# Patient Record
Sex: Male | Born: 1955 | Race: White | Hispanic: Yes | Marital: Married | Smoking: Never smoker
Health system: Southern US, Community
[De-identification: ages and names within clinical notes are randomized; demographics above are authoritative.]

## PROBLEM LIST (undated history)

## (undated) DIAGNOSIS — I4891 Unspecified atrial fibrillation: Secondary | ICD-10-CM

## (undated) DIAGNOSIS — I10 Essential (primary) hypertension: Secondary | ICD-10-CM

## (undated) HISTORY — PX: REPLACEMENT TOTAL KNEE: SUR1224

## (undated) HISTORY — DX: Essential (primary) hypertension: I10

## (undated) HISTORY — DX: Unspecified atrial fibrillation: I48.91

## (undated) HISTORY — PX: SPLENECTOMY: SUR1306

## (undated) HISTORY — PX: PARTIAL HIP ARTHROPLASTY: SHX733

---

## 1985-03-23 HISTORY — PX: SPLENECTOMY: SUR1306

## 2005-01-21 ENCOUNTER — Other Ambulatory Visit: Payer: Self-pay

## 2005-01-21 ENCOUNTER — Emergency Department: Payer: Self-pay | Admitting: Emergency Medicine

## 2006-04-05 ENCOUNTER — Ambulatory Visit: Payer: Self-pay

## 2007-09-13 ENCOUNTER — Encounter: Admission: RE | Admit: 2007-09-13 | Discharge: 2007-09-13 | Payer: Self-pay | Admitting: Orthopedic Surgery

## 2008-01-12 ENCOUNTER — Encounter: Admission: RE | Admit: 2008-01-12 | Discharge: 2008-01-12 | Payer: Self-pay | Admitting: Orthopedic Surgery

## 2008-03-23 HISTORY — PX: PARTIAL HIP ARTHROPLASTY: SHX733

## 2008-05-31 ENCOUNTER — Encounter: Admission: RE | Admit: 2008-05-31 | Discharge: 2008-05-31 | Payer: Self-pay | Admitting: Orthopedic Surgery

## 2008-08-22 ENCOUNTER — Inpatient Hospital Stay (HOSPITAL_COMMUNITY): Admission: RE | Admit: 2008-08-22 | Discharge: 2008-08-25 | Payer: Self-pay | Admitting: Orthopedic Surgery

## 2010-01-29 ENCOUNTER — Encounter: Admission: RE | Admit: 2010-01-29 | Discharge: 2010-01-29 | Payer: Self-pay | Admitting: Orthopedic Surgery

## 2010-04-12 ENCOUNTER — Encounter: Payer: Self-pay | Admitting: Orthopedic Surgery

## 2010-06-30 LAB — CBC
HCT: 34.6 % — ABNORMAL LOW (ref 39.0–52.0)
HCT: 35.8 % — ABNORMAL LOW (ref 39.0–52.0)
Hemoglobin: 11.8 g/dL — ABNORMAL LOW (ref 13.0–17.0)
Hemoglobin: 12 g/dL — ABNORMAL LOW (ref 13.0–17.0)
MCHC: 33.4 g/dL (ref 30.0–36.0)
MCV: 94.5 fL (ref 78.0–100.0)
MCV: 94.6 fL (ref 78.0–100.0)
MCV: 94.8 fL (ref 78.0–100.0)
Platelets: 149 10*3/uL — ABNORMAL LOW (ref 150–400)
RBC: 3.73 MIL/uL — ABNORMAL LOW (ref 4.22–5.81)
RDW: 13 % (ref 11.5–15.5)
RDW: 13.2 % (ref 11.5–15.5)

## 2010-06-30 LAB — BASIC METABOLIC PANEL
BUN: 5 mg/dL — ABNORMAL LOW (ref 6–23)
CO2: 31 mEq/L (ref 19–32)
CO2: 32 mEq/L (ref 19–32)
Chloride: 103 mEq/L (ref 96–112)
Chloride: 103 mEq/L (ref 96–112)
Chloride: 103 mEq/L (ref 96–112)
GFR calc Af Amer: >60 mL/min (ref >60)
GFR calc non Af Amer: >60 mL/min (ref >60)
Glucose, Bld: 108 mg/dL — ABNORMAL HIGH (ref 70–99)
Glucose, Bld: 109 mg/dL — ABNORMAL HIGH (ref 70–99)
Potassium: 3.3 mEq/L — ABNORMAL LOW (ref 3.5–5.1)
Potassium: 3.9 mEq/L (ref 3.5–5.1)
Sodium: 138 mEq/L (ref 135–145)
Sodium: 139 mEq/L (ref 135–145)

## 2010-06-30 LAB — TYPE AND SCREEN

## 2010-07-01 LAB — URINALYSIS, ROUTINE W REFLEX MICROSCOPIC
Bilirubin Urine: NEGATIVE
Glucose, UA: NEGATIVE mg/dL
Hgb urine dipstick: NEGATIVE
Specific Gravity, Urine: 1.011 (ref 1.005–1.030)

## 2010-07-01 LAB — COMPREHENSIVE METABOLIC PANEL
ALT: 18 U/L (ref 0–53)
Albumin: 3.8 g/dL (ref 3.5–5.2)
Alkaline Phosphatase: 85 U/L (ref 39–117)
BUN: 14 mg/dL (ref 6–23)
Chloride: 105 mEq/L (ref 96–112)
Glucose, Bld: 95 mg/dL (ref 70–99)
Potassium: 4.5 mEq/L (ref 3.5–5.1)
Sodium: 142 mEq/L (ref 135–145)
Total Bilirubin: 0.9 mg/dL (ref 0.3–1.2)
Total Protein: 6.6 g/dL (ref 6.0–8.3)

## 2010-07-01 LAB — CBC
MCHC: 33.5 g/dL (ref 30.0–36.0)
MCV: 95 fL (ref 78.0–100.0)
RBC: 4.7 MIL/uL (ref 4.22–5.81)
RDW: 13.4 % (ref 11.5–15.5)

## 2010-07-01 LAB — PROTIME-INR: Prothrombin Time: 12.8 seconds (ref 11.6–15.2)

## 2010-08-05 NOTE — Op Note (Signed)
NAMEWALLACE, GAPPA             ACCOUNT NO.:  [id number]   MEDICAL RECORD NO.:  [id number]          PATIENT TYPE:  INP   LOCATION:  0004                         FACILITY:  [facility]   PHYSICIAN:  Ollen Gross, M.D.    DATE OF BIRTH:  12-06-1955   DATE OF PROCEDURE:  08/22/2008  DATE OF DISCHARGE:                               OPERATIVE REPORT   PREOPERATIVE DIAGNOSIS:  Osteoarthritis, left hip.   POSTOPERATIVE DIAGNOSIS:  Osteoarthritis, left hip.   PROCEDURE:  Left total hip arthroplasty.   SURGEON:  Ollen Gross, M.D.   ASSISTANT:  Avel Peace, P.A.-C.   ANESTHESIA:  General.   ESTIMATED BLOOD LOSS:  300.   DRAINS:  Hemovac x1.   COMPLICATIONS:  None.   CONDITION:  Stable to recovery.   BRIEF CLINICAL NOTE:  Mr. Lederman is a 55 year old male with severe end-  stage arthritis of his left hip with some collapse of the femoral head.  He has had progressively worsening pain and dysfunction and presents now  for total hip arthroplasty.   PROCEDURE IN DETAIL:  After successful administration of general  anesthetic, the patient was placed in the right lateral decubitus  position with the left side up and held with the hip positioner.  The  left lower extremity was isolated from his perineum with plastic drapes  and prepped and draped in the usual sterile fashion.  Short  posterolateral incision was made with a 10 blade through subcutaneous  tissue to the level of the fascia lata which was incised in line with  the skin incision.  Sciatic nerve was palpated and protected and short  external rotator isolated off the femur.  Capsulotomy was performed, and  the hip was dislocated.  The hip was grossly degenerative with  flattening and large spur formation.  Center of femoral head was marked,  and a trial prosthesis was then placed such that the center of the trial  head corresponds to the center of his native femoral head.  Osteotomy  line was marked on the femoral neck, and  osteotomy made with an  oscillating saw.   Femoral head was removed and the femur retracted anteriorly to gain  acetabular exposure.  The acetabular retractors were placed and labrum  and large osteophytes removed.  Acetabular reaming starts at 49 mm  coursing in increments of 2 to 57 mm.  There were 2 large cysts in the  acetabulum which were curetted out, and then reamings were packed into  them to graft them.  The 58-mm pinnacle acetabular shell was then placed  in anatomic position and transfixed with 2 dome screws.  The apex hole  eliminator was placed, and the permanent 40-mm neutral Ultamet metal  liner was placed for metal-on-metal hip replacement.   The femur was then prepared with the canal finder and irrigation.  Axial  reaming was performed up to 13.5 mm.  The proximal reaming was performed  up to an 46F and the sleeve machined to an extra-extra large.  An 46F  extra-extra large trial sleeve was placed and 18 x 13 stem and a 36 plus  12 neck matching native anteversion.  The 40 plus 6 head was placed, and  that reduced with some soft tissue laxity, so went to a 40 plus 9 which  had more appropriate soft-tissue tension.  Stability was excellent full  extension, full external rotation, 70 degrees of flexion, 40 degrees of  adduction and 90 degrees of internal rotation and 90 degrees of flexion  and about 70 degrees of internal rotation.  By placing the left leg on  top of the right, it felt as though the leg lengths were equal.  The hip  was then dislocated and trials removed.  The permanent 30F extra-extra  large sleeve was placed and a permanent 18 x 13 stem and 36 plus 12 neck  matching native anteversion.  A 40 plus 6 head was placed and the hips  reduced with the same stability parameters.  Wounds were copiously  irrigated with saline solution and short rotators and capsule reattached  to the femur through drill holes.  Fascia lata was closed over a Hemovac  drain with  interrupted #1 Vicryl, subcutaneous tissue closed with #1-0  and #2-0 Vicryl and subcuticular running 4-0 Monocryl.  The drain was  hooked to suction.  Incision cleaned and dried, and Steri-Strips and  bulky sterile dressing applied.  She was then awakened and transported  to recovery in stable condition.      Ollen Gross, M.D.  Electronically Signed     FA/MEDQ  D:  08/22/2008  T:  08/22/2008  Job:  [id number]

## 2010-08-05 NOTE — H&P (Signed)
NAMEELIN, SEATS NO.:  [id number]   MEDICAL RECORD NO.:  [id number]          PATIENT TYPE:  INP   LOCATION:  1526                         FACILITY:  [facility]   PHYSICIAN:  Ollen Gross, M.D.    DATE OF BIRTH:  [birth year]   DATE OF ADMISSION:  08/22/2008  DATE OF DISCHARGE:                              HISTORY & PHYSICAL   Date of office visit history and physical Aug 07, 2008.   CHIEF COMPLAINT:  Left hip pain.   HISTORY OF PRESENT ILLNESS:  The patient is a 55 year old male who has  been seen over at [facility] for ongoing left hip pain.  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  He has developed end-stage bone-on-bone arthritis  throughout the hip.  He has undergone cortisone injection in the past  which did provide symptomatic relief for a short period of time but  continues to have progressive pain.  He is found now to have end-stage  severe osteoarthritis with flattening of the femoral head and large  cystic changes.  It is felt at this point he would benefit from  undergoing surgical intervention.  Risks and benefits have been  discussed, and he elects to proceed with surgery.  He has been seen by  Dr. Darrold Junker and felt to be a low risk for up and coming surgery,  recommended continuing his metoprolol throughout the surgery.   ALLERGIES:  SULFA with a childhood reaction.   CURRENT MEDICATIONS:  Mobic, metoprolol, vitamin C, vitamin D, Super B  Complex, potassium and Flonase nasal spray.   PAST MEDICAL HISTORY:  1. Sleep apnea for which he uses CPAP.  2. Past history of paroxysmal atrial fibrillation.   PAST SURGICAL HISTORY:  1. Knee surgery in 1985.  2. Ruptured spleen in 1987 (no problems with anesthesia).   FAMILY HISTORY:  Father deceased age 39 with a heart attack.  Mother age  54 with osteoarthritis.   SOCIAL HISTORY:  He is married, works as a Museum/gallery curator,  nonsmoker, no alcohol.  He does have a care giver lined up after  surgery.  He has 4 steps entering his 1-level home.   REVIEW OF SYSTEMS:  GENERAL:  No fevers, chills or night sweats.  NEUROLOGICAL:  No seizure, syncope or paralysis.  RESPIRATORY:  No  shortness of breath, productive cough or hemoptysis.  CARDIOVASCULAR:  No chest pain, angina or orthopnea.  GASTROINTESTINAL:  No nausea,  vomiting, diarrhea or constipation.  GENITOURINARY:  No dysuria,  hematuria or discharge.  MUSCULOSKELETAL:  Left hip.   PHYSICAL EXAMINATION:  VITAL SIGNS:  Pulse 68, respirations 12, blood  pressure 112/64.  GENERAL:  A 55 year old, tall, African-American male, well-developed,  well-nourished, in no acute distress.  He is accompanied by his wife.  HEENT:  Normocephalic, atraumatic.  Pupils are round and reactive.  Oropharynx clear.  EOMs intact.  NECK:  Supple.  CHEST:  Clear.  HEART:  Regular rate and rhythm.  No murmur.  S1-S2 noted.  ABDOMEN:  Soft, nontender.  Bowel sounds present.  RECTAL, BREAST, GENITALIA:  Not done, not pertinent to present illness.  EXTREMITIES:  Left hip flexion 100 zero internal rotation 20 degrees  external rotation.  Pulses noted to be intact.   IMPRESSION:  Osteoarthritis, left hip.   PLAN:  The patient admitted to the [facility] to undergo a  left total hip replacement arthroplasty.  Surgery will be performed by  Dr. Ollen Gross.      Alexzandrew L. Lorensen, P.A.C.      Ollen Gross, M.D.  Electronically Signed    ALP/MEDQ  D:  08/22/2008  T:  08/22/2008  Job:  [id number]   cc:   Ollen Gross, M.D.  Fax: [phone]   Jamse Mead, M.D.  [facility]  [address] [place], [place] [address]  [phone]

## 2010-08-08 NOTE — Discharge Summary (Signed)
Oscar Pennington, Oscar Pennington             ACCOUNT NO.:  [id number]   MEDICAL RECORD NO.:  [id number]          PATIENT TYPE:  INP   LOCATION:  1526                         FACILITY:  [facility]   PHYSICIAN:  Ollen Gross, M.D.    DATE OF BIRTH:  1956-02-05   DATE OF ADMISSION:  08/22/2008  DATE OF DISCHARGE:  08/25/2008                               DISCHARGE SUMMARY   ADMITTING DIAGNOSES.:  1. Osteoarthritis left hip.  2. Sleep apnea, which he uses a CPAP.  3. Past history of paroxysmal atrial fibrillation.   DISCHARGE DIAGNOSES:  1. Osteoarthritis left hip status post left total hip replacement      arthroplasty.  2. Sleep apnea, which he uses a CPAP.  3. Past history of paroxysmal atrial fibrillation.  4. Mild postop hypokalemia.   PROCEDURE:  August 22, 2008 left total hip.   SURGEON:  Dr. Lequita Halt.   ASSISTANT:  Alexzandrew L. Noguera, P.A.C.   ANESTHESIA:  General.   CONSULTS:  None.   BRIEF HISTORY:  Oscar Pennington is a 55 year old male with severe end-stage  arthritis of the left hip, progressive pain with collapse of femoral  head, now presents for total hip arthroplasty.   LABORATORY DATA:  Preop CBC showed a hemoglobin of 14.9, hematocrit  44.7, white cell count 5.6, platelets 186.  Postop hemoglobin at 12 and  then 11.8, last H and H 11.4 and 34.6.  PT/PTT 12.8 and 27.0  respectively.  INR 1.0.  Serial Protime's followed per Coumadin  protocol.  Last PT/INR 21.6, 1.8. Chem panel on admission all within  normal limits.  Serial BMET's were followed.  Potassium dropped 4.5 to  3.6.  Last noted at 3.3.  Remaining electrolytes remained within normal  limits.  Preoperative urine was negative.  Blood group type was O  negative.   HOSPITAL COURSE:  The patient admitted to [facility] and taken to  the OR, underwent above-stated procedure without complication.  The  patient tolerated the procedure well, later transferred to the  orthopedic floor, had a decent night, a little bit  of  soreness on  morning of day 1.  Hemoglobin stable.  Had a history of atrial fib but  the rate was in the 70s, well-controlled.  He is on beta blockers as  needed.  Hemovac drain placed at the time of surgery was pulled without  difficulty.  Started  getting up out of bed.  He was partial  weightbearing.  Did well with his therapy.  Already walking 200-300 feet  on day 1.  By day 2,  he was doing well, progressing with his goals of  therapy and thought he would be ready to go home by the following day.  Seen on day 3, walking over 400 feet and then discharged home.   DISCHARGE/PLAN:  The patient discharged home on August 25, 2008.   DISCHARGE DIAGNOSES:  Please see above.   DISCHARGE MEDS:  Percocet, Robaxin, Coumadin.   DIET:  Heart-healthy.   ACTIVITIES:  Partial weightbearing 25-50% left leg.   HIP PRECAUTIONS:  Total hip protocol.   DISPOSITION:  Home.  CONDITION ON DISCHARGE:  Improved.      Alexzandrew L. Weichert, P.A.C.      Ollen Gross, M.D.  Electronically Signed    ALP/MEDQ  D:  10/02/2008  T:  10/02/2008  Job:  [id number]   cc:   Ollen Gross, M.D.  Fax: [phone]   Carney Bern, Dr.

## 2011-07-20 ENCOUNTER — Emergency Department: Payer: Self-pay | Admitting: Emergency Medicine

## 2011-07-20 LAB — URINALYSIS, COMPLETE
Bacteria: NONE SEEN
Bilirubin,UR: NEGATIVE
Blood: NEGATIVE
Glucose,UR: NEGATIVE mg/dL (ref 0–75)
Leukocyte Esterase: NEGATIVE
RBC,UR: NONE SEEN /HPF (ref 0–5)
Specific Gravity: 1.001 (ref 1.003–1.030)
Squamous Epithelial: NONE SEEN

## 2011-07-20 LAB — CBC
HCT: 44.4 % (ref 40.0–52.0)
HGB: 14.8 g/dL (ref 13.0–18.0)
MCHC: 33.4 g/dL (ref 32.0–36.0)
RBC: 4.73 10*6/uL (ref 4.40–5.90)

## 2011-07-20 LAB — COMPREHENSIVE METABOLIC PANEL
Albumin: 3.7 g/dL (ref 3.4–5.0)
Anion Gap: 10 (ref 7–16)
BUN: 9 mg/dL (ref 7–18)
Calcium, Total: 9.1 mg/dL (ref 8.5–10.1)
Chloride: 102 mmol/L (ref 98–107)
Co2: 27 mmol/L (ref 21–32)
Glucose: 109 mg/dL — ABNORMAL HIGH (ref 65–99)
Osmolality: 277 (ref 275–301)
SGPT (ALT): 45 U/L

## 2011-07-20 LAB — MAGNESIUM: Magnesium: 1.5 mg/dL — ABNORMAL LOW

## 2011-07-20 LAB — CK TOTAL AND CKMB (NOT AT ARMC)
CK, Total: 152 U/L (ref 35–232)
CK-MB: 1.1 ng/mL (ref 0.5–3.6)

## 2011-07-20 LAB — PRO B NATRIURETIC PEPTIDE: B-Type Natriuretic Peptide: 158 pg/mL — ABNORMAL HIGH (ref 0–125)

## 2012-10-06 ENCOUNTER — Emergency Department: Payer: Self-pay | Admitting: Internal Medicine

## 2012-10-06 LAB — BASIC METABOLIC PANEL
Anion Gap: 5 — ABNORMAL LOW (ref 7–16)
BUN: 8 mg/dL (ref 7–18)
Calcium, Total: 9.1 mg/dL (ref 8.5–10.1)
Co2: 29 mmol/L (ref 21–32)
Creatinine: 1.01 mg/dL (ref 0.60–1.30)
EGFR (African American): >60
Glucose: 90 mg/dL (ref 65–99)
Potassium: 3.7 mmol/L (ref 3.5–5.1)
Sodium: 138 mmol/L (ref 136–145)

## 2012-10-06 LAB — CBC
HGB: 14.2 g/dL (ref 13.0–18.0)
MCH: 32.1 pg (ref 26.0–34.0)
Platelet: 145 10*3/uL — ABNORMAL LOW (ref 150–440)
RBC: 4.44 10*6/uL (ref 4.40–5.90)
RDW: 13.6 % (ref 11.5–14.5)
WBC: 5.5 10*3/uL (ref 3.8–10.6)

## 2012-10-06 LAB — TSH: Thyroid Stimulating Horm: 1.14 u[IU]/mL

## 2013-10-11 DIAGNOSIS — N529 Male erectile dysfunction, unspecified: Secondary | ICD-10-CM | POA: Insufficient documentation

## 2014-05-28 DIAGNOSIS — I10 Essential (primary) hypertension: Secondary | ICD-10-CM | POA: Insufficient documentation

## 2015-05-18 IMAGING — CR DG CHEST 2V
1 series · 2 of 2 positions shown · non-contrast
Comparison: none

REASON FOR EXAM: Chest Pain
COMMENTS:

[Series 1: w chest pa · 0.14mm/px · 2 of 2 slices shown]
[im 1/2]
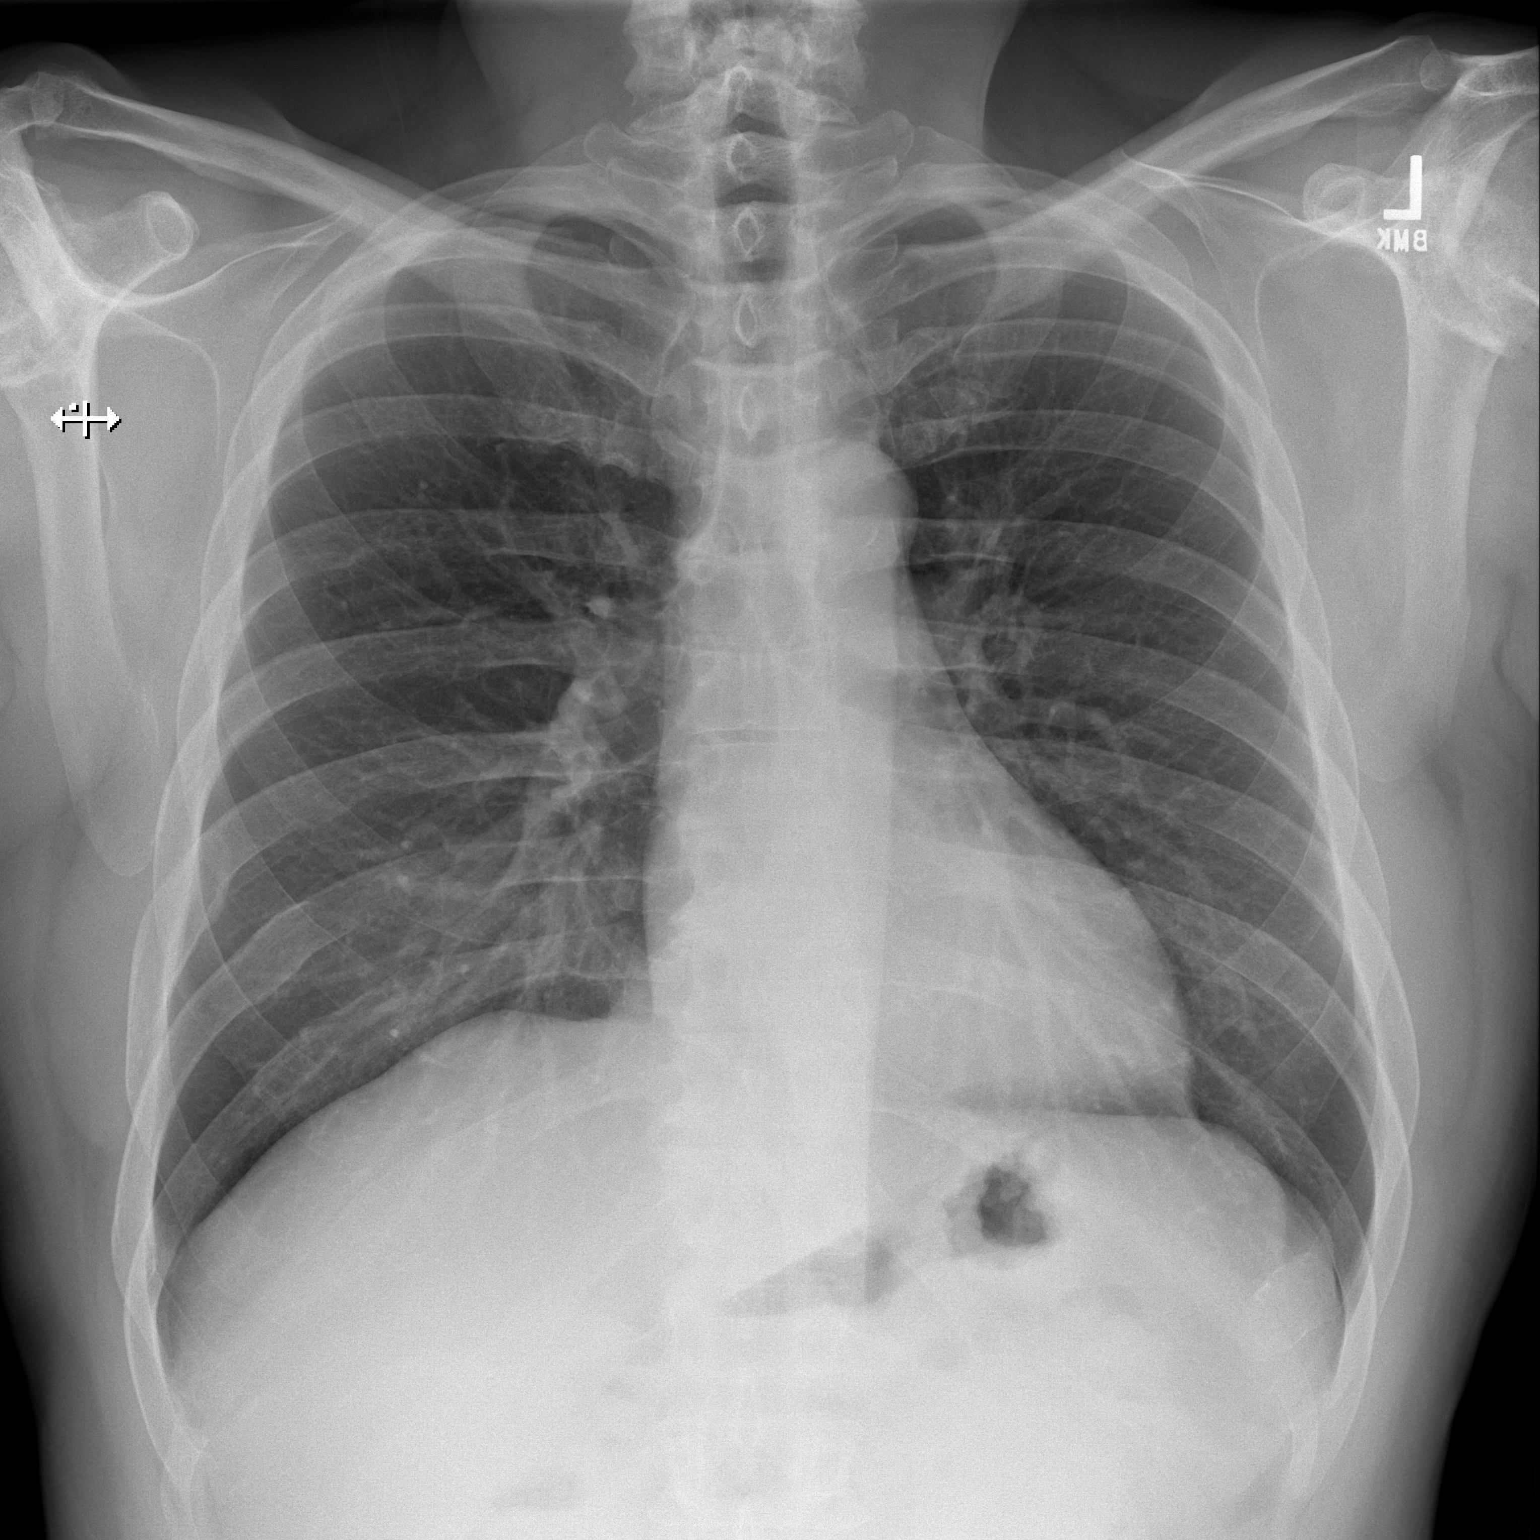
[im 2/2]
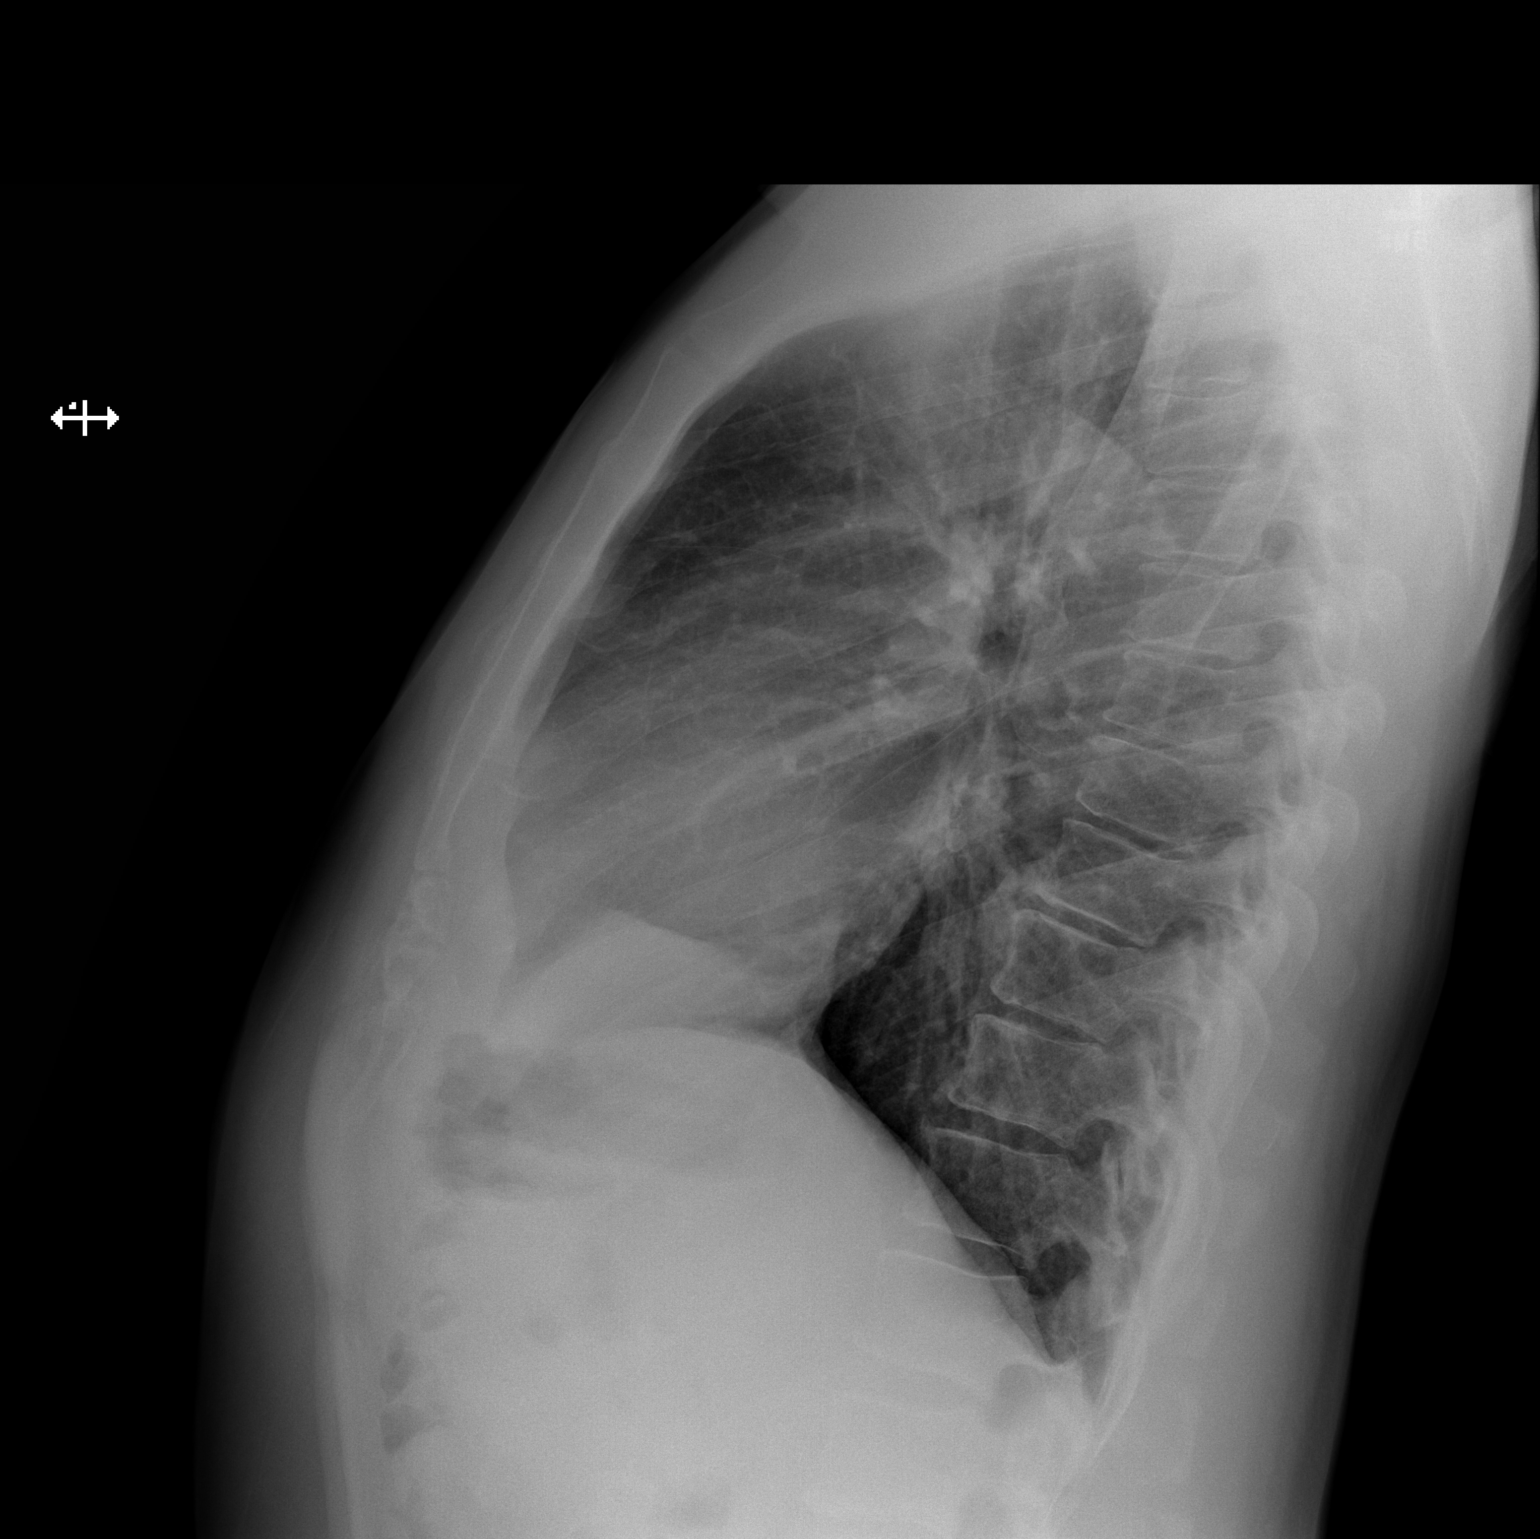

[2 of 2 positions shown; findings below may reference images not displayed]

PROCEDURE:     DXR - DXR CHEST PA (OR AP) AND LATERAL  - October 06, 2012  [DATE]

RESULT:     Comparison is made to the study 20 July, 2011.

The lungs are well-expanded. There is mild prominence of perihilar lung
markings. The cardiac silhouette is normal in size. The pulmonary
vascularity is not engorged. There is no pleural effusion.
IMPRESSION: There is mild prominence of the pulmonary interstitial
markings which could reflect minimal interstitial edema in the appropriate
clinical setting. There is no focal pneumonia nor evidence of a pleural
effusion.

[REDACTED]

## 2016-03-06 ENCOUNTER — Emergency Department
Admission: EM | Admit: 2016-03-06 | Discharge: 2016-03-06 | Disposition: A | Discharge status: Home / Self Care | Payer: BC Managed Care – PPO | Attending: Emergency Medicine | Admitting: Emergency Medicine

## 2016-03-06 ENCOUNTER — Encounter: Payer: Self-pay | Admitting: Emergency Medicine

## 2016-03-06 DIAGNOSIS — S76311A Strain of muscle, fascia and tendon of the posterior muscle group at thigh level, right thigh, initial encounter: Secondary | ICD-10-CM | POA: Diagnosis not present

## 2016-03-06 DIAGNOSIS — Y929 Unspecified place or not applicable: Secondary | ICD-10-CM | POA: Insufficient documentation

## 2016-03-06 DIAGNOSIS — Y939 Activity, unspecified: Secondary | ICD-10-CM | POA: Diagnosis not present

## 2016-03-06 DIAGNOSIS — X58XXXA Exposure to other specified factors, initial encounter: Secondary | ICD-10-CM | POA: Diagnosis not present

## 2016-03-06 DIAGNOSIS — Y999 Unspecified external cause status: Secondary | ICD-10-CM | POA: Diagnosis not present

## 2016-03-06 DIAGNOSIS — T148XXA Other injury of unspecified body region, initial encounter: Secondary | ICD-10-CM

## 2016-03-06 DIAGNOSIS — S79921A Unspecified injury of right thigh, initial encounter: Secondary | ICD-10-CM | POA: Diagnosis present

## 2016-03-06 HISTORY — DX: Unspecified atrial fibrillation: I48.91

## 2016-03-06 NOTE — ED Triage Notes (Signed)
Pt comes into the ED via POV c/o bruising and swelling on the right leg behind his knee.  He states he is currently on blood thinners for his atrial fibrillation.  Patient pulled his hamstring a week ago and noticed the discoloration starting yesterday.  Minimal discomfort when placing a lot of pressure on that leg, but patient is in NAD at this time with normal gait into the triage room.

## 2016-03-06 NOTE — ED Notes (Signed)
Pt stating that he pulled his hamstring one week ago. Pt started noticing bruising on the RLE Wednesday. Bruising has continued to become larger since then. Area is noted to upper RLE posterior of bruising. Pt denying any redness or warmth. Pt stating that pain is only occurring with pressure.

## 2016-03-06 NOTE — ED Provider Notes (Signed)
[facility] Emergency Department Provider Note ____________________________________________  Time seen: 2055  I have reviewed the triage vital signs and the nursing notes.  HISTORY  Chief Complaint  Bleeding/Bruising  HPI Oscar Pennington is a 60 y.o. male since the ED accompanied by his wife, for evaluation of bruising to the posterior right leg. Patient reports that he pulled his hamstring muscle about a week prior. It wasn't until yesterday that he was aware of the discoloration to the posterior calf or thigh. His wife noted the discoloration today, and was concerned enough to have him report to the ED. His history is significant in that he is currently on anticoagulant therapy for the last 9 years, due to atrial fibrillation. He denies any distal paresthesias, swelling, skin temperature/color changes, or pain with ambulation. He also is without any significant pain to the posterior thigh or calf region. He is nontender to the bruising.  Past Medical History:  Diagnosis Date  . Atrial fibrillation (HCC)     There are no active problems to display for this patient.   Past Surgical History:  Procedure Laterality Date  . PARTIAL HIP ARTHROPLASTY    . REPLACEMENT TOTAL KNEE    . SPLENECTOMY      Prior to Admission medications   Not on File    Allergies Ciprofloxacin and Sulfa antibiotics  No family history on file.  Social History Social History  Substance Use Topics  . Smoking status: Never Smoker  . Smokeless tobacco: Never Used  . Alcohol use Yes     Comment: occasional    Review of Systems  Constitutional: Negative for fever. Cardiovascular: Negative for chest pain. Respiratory: Negative for shortness of breath. Gastrointestinal: Negative for abdominal pain, vomiting and diarrhea. Genitourinary: Negative for dysuria. Musculoskeletal: Negative for back pain. Denies right thigh or calf pain. Skin: Negative for rash. Posterior right  lotion any bruising as above. Neurological: Negative for headaches, focal weakness or numbness. ____________________________________________  PHYSICAL EXAM:  VITAL SIGNS: ED Triage Vitals [03/06/16 2119]  Enc Vitals Group     BP (!) 157/66     Pulse Rate 81     Resp 18     Temp 98.1 F (36.7 C)     Temp Source Oral     SpO2 100 %     Weight 250 lb (113.4 kg)     Height 6\' 3"  (1.905 m)     Head Circumference      Peak Flow      Pain Score 3     Pain Loc      Pain Edu?      Excl. in GC?    Constitutional: Alert and oriented. Well appearing and in no distress. Head: Normocephalic and atraumatic. Cardiovascular: Normal rate, regular rhythm. Normal distal pulses. Respiratory: Normal respiratory effort. No wheezes/rales/rhonchi. Gastrointestinal: Soft and nontender. No distention. Musculoskeletal:  Right lower extremity with normal knee exam. Patient noted to have moderate bruising and ecchymosis to the distal hamstring. There is some extension of the early ecchymosis to the lateral proximal calf as well. There is no palpable muscle defect, hematoma, or cords distally. Patient without any tenderness to palpation over the hamstring insertion. Normal knee flexion and extension range on exam. No calf or Achilles tenderness is noted. Negative Thompson test. Nontender with normal range of motion in all extremities.  Neurologic:  Normal gait without ataxia. Normal speech and language. No gross focal neurologic deficits are appreciated. Skin:  Skin is warm, dry and intact.  No rash noted. ____________________________________________  INITIAL IMPRESSION / ASSESSMENT AND PLAN / ED COURSE  Patient with partial hamstring tear on the right, with subsequent delayed ecchymosis presentation. Patient's exam is otherwise benign as he is nontender to palpation over the muscle injury. No palpable muscle defect cords appreciated distally. Patient exam is otherwise benign without any indication of distal  clot formation. Patient is reassured by the exam and discharged to follow with his primary care provider. Instructions on management of muscle tear are provided.  Clinical Course    ____________________________________________  FINAL CLINICAL IMPRESSION(S) / ED DIAGNOSES  Final diagnoses:  Partial hamstring tear, right, initial encounter  Hematoma of muscle      Lissa HoardJenise V Bacon Adalena Abdulla, PA-C 03/06/16 2344    Minna AntisKevin Paduchowski, MD 03/09/16 [phone]

## 2016-03-06 NOTE — Discharge Instructions (Signed)
Your exam is consistent with a hamstring muscle tear. You have delayed muscle bruising which is common after a deep muscle tear. Apply warm compresses to promote healing. It may take weeks for your bruising to resolve.

## 2017-06-28 DIAGNOSIS — M1711 Unilateral primary osteoarthritis, right knee: Secondary | ICD-10-CM | POA: Insufficient documentation

## 2020-08-29 ENCOUNTER — Encounter: Payer: Self-pay | Admitting: Internal Medicine

## 2020-08-29 ENCOUNTER — Other Ambulatory Visit: Payer: Self-pay | Admitting: Internal Medicine

## 2020-08-29 DIAGNOSIS — G4733 Obstructive sleep apnea (adult) (pediatric): Secondary | ICD-10-CM | POA: Insufficient documentation

## 2020-08-29 DIAGNOSIS — G4730 Sleep apnea, unspecified: Secondary | ICD-10-CM | POA: Insufficient documentation

## 2020-08-29 DIAGNOSIS — I480 Paroxysmal atrial fibrillation: Secondary | ICD-10-CM | POA: Insufficient documentation

## 2020-08-30 ENCOUNTER — Ambulatory Visit: Payer: Self-pay | Admitting: Internal Medicine

## 2020-12-17 ENCOUNTER — Ambulatory Visit: Payer: Self-pay | Admitting: Internal Medicine

## 2022-09-23 ENCOUNTER — Encounter: Payer: Self-pay | Admitting: Internal Medicine

## 2022-09-23 ENCOUNTER — Ambulatory Visit: Payer: BC Managed Care – PPO | Admitting: Internal Medicine

## 2022-09-23 VITALS — BP 122/74 | HR 71 | Ht 75.0 in | Wt 246.0 lb

## 2022-09-23 DIAGNOSIS — I480 Paroxysmal atrial fibrillation: Secondary | ICD-10-CM | POA: Diagnosis not present

## 2022-09-23 DIAGNOSIS — I10 Essential (primary) hypertension: Secondary | ICD-10-CM

## 2022-09-23 DIAGNOSIS — Z1211 Encounter for screening for malignant neoplasm of colon: Secondary | ICD-10-CM

## 2022-09-23 DIAGNOSIS — Z125 Encounter for screening for malignant neoplasm of prostate: Secondary | ICD-10-CM

## 2022-09-23 DIAGNOSIS — G4733 Obstructive sleep apnea (adult) (pediatric): Secondary | ICD-10-CM | POA: Diagnosis not present

## 2022-09-23 NOTE — Progress Notes (Signed)
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  Hypertension This is a chronic problem. The problem is controlled. Associated symptoms include palpitations (rare Afib). Pertinent negatives include no chest pain, headaches or shortness of breath. Past treatments include beta blockers. The current treatment provides significant improvement. Hypertensive end-organ damage includes CAD/MI. There is no history of kidney disease or heart failure.  Afib - onset 2008.  Treated with metoprolol and ASA.  He has declined Eliquis or warfarin. Episodes about every 6 months lasting 12 hours.  He uses prn metoprolol to abort episodes. OSA - on cpap since Afib diagnosis. He uses it nightly and feel well.   Lab Results  Component Value Date   NA 138 10/06/2012   K 3.7 10/06/2012   CO2 29 10/06/2012   GLUCOSE 90 10/06/2012   BUN 8 10/06/2012   CREATININE 1.01 10/06/2012   CALCIUM 9.1 10/06/2012   GFRNONAA >60 10/06/2012   No results found for: "CHOL", "HDL", "LDLCALC", "LDLDIRECT", "TRIG", "CHOLHDL" Lab Results  Component Value Date   TSH 1.14 10/06/2012   No results found for: "HGBA1C" Lab Results  Component Value Date   WBC 5.5 10/06/2012   HGB 14.2 10/06/2012   HCT 41.9 10/06/2012   MCV 94 10/06/2012   PLT 145 (L) 10/06/2012   Lab Results  Component Value Date   ALT 45 07/20/2011   AST 29 07/20/2011   ALKPHOS 84 07/20/2011   BILITOT 0.7 07/20/2011   No results found for: "25OHVITD2", "25OHVITD3", "VD25OH"   Review of Systems  Constitutional:  Negative for chills, fever and unexpected weight change.  Respiratory:  Negative for chest tightness and shortness of breath.   Cardiovascular:  Positive for palpitations (rare Afib). Negative for chest pain and leg swelling.  Gastrointestinal:  Negative for abdominal pain, constipation and diarrhea.   Genitourinary:  Negative for difficulty urinating.  Musculoskeletal:  Negative for arthralgias.  Neurological:  Negative for dizziness, weakness and headaches.  Psychiatric/Behavioral:  Negative for dysphoric mood and sleep disturbance. The patient is not nervous/anxious.     Patient Active Problem List   Diagnosis Date Noted   PAF (paroxysmal atrial fibrillation) (HCC) 08/29/2020   Obstructive sleep apnea 08/29/2020   Osteoarthritis of right knee 06/28/2017   Essential hypertension 05/28/2014   Male erectile dysfunction, unspecified 10/11/2013    Allergies  Allergen Reactions   Ciprofloxacin    Sulfa Antibiotics     Past Surgical History:  Procedure Laterality Date   PARTIAL HIP ARTHROPLASTY Left 2010   REPLACEMENT TOTAL KNEE     SPLENECTOMY  1987    Social History   Tobacco Use   Smoking status: Never   Smokeless tobacco: Never  Substance Use Topics   Alcohol use: Yes    Comment: occasional   Drug use: Never     Medication list has been reviewed and updated.  Current Meds  Medication Sig   aspirin 325 MG tablet aspirin 325 mg tablet  Take 1 tablet every day by oral route.   b complex vitamins capsule Take 1 capsule by mouth daily.   ergocalciferol (VITAMIN D2) 1.25 MG (50000 UT) capsule Take by mouth.   fluticasone (FLONASE) 50 MCG/ACT nasal spray Place into the nose.   Magnesium (RA NATURAL MAGNESIUM) 250 MG TABS Take by mouth.   metoprolol succinate (TOPROL-XL) 100  MG 24 hr tablet metoprolol succinate ER 100 mg tablet,extended release 24 hr   sildenafil (REVATIO) 20 MG tablet 2-5 tablets daily as needed       09/23/2022    2:28 PM  GAD 7 : Generalized Anxiety Score  Nervous, Anxious, on Edge 0  Control/stop worrying 0  Worry too much - different things 0  Trouble relaxing 0  Restless 0  Easily annoyed or irritable 0  Afraid - awful might happen 0  Total GAD 7 Score 0  Anxiety Difficulty Not difficult at all       09/23/2022    2:27 PM   Depression screen PHQ 2/9  Decreased Interest 0  Down, Depressed, Hopeless 0  PHQ - 2 Score 0  Altered sleeping 0  Tired, decreased energy 0  Change in appetite 0  Feeling bad or failure about yourself  0  Trouble concentrating 0  Moving slowly or fidgety/restless 0  Suicidal thoughts 0  PHQ-9 Score 0  Difficult doing work/chores Not difficult at all    BP Readings from Last 3 Encounters:  09/23/22 122/74  03/06/16 120/72    Physical Exam Constitutional:      Appearance: Normal appearance.  Neck:     Vascular: No carotid bruit.  Cardiovascular:     Rate and Rhythm: Normal rate and regular rhythm.     Pulses: Normal pulses.     Heart sounds: No murmur heard. Pulmonary:     Effort: Pulmonary effort is normal.     Breath sounds: No wheezing or rhonchi.  Musculoskeletal:     Cervical back: Normal range of motion.     Right lower leg: No edema.     Left lower leg: No edema.  Lymphadenopathy:     Cervical: No cervical adenopathy.  Neurological:     General: No focal deficit present.     Mental Status: He is alert.  Psychiatric:        Mood and Affect: Mood normal.        Behavior: Behavior normal.     Wt Readings from Last 3 Encounters:  09/23/22 246 lb (111.6 kg)  03/06/16 250 lb (113.4 kg)    BP 122/74   Pulse 71   Ht 6\' 3"  (1.905 m)   Wt 246 lb (111.6 kg)   SpO2 94%   BMI 30.75 kg/m   Assessment and Plan:  Problem List Items Addressed This Visit     PAF (paroxysmal atrial fibrillation) (HCC)    Doing well with rare episodes last about 12 hours - takes an extra metoprolol as needed. On ASA 325 mg daily - Eliquis deferred      Relevant Orders   Lipid panel   Obstructive sleep apnea    On Cpap since 2008.  Uses nightly with good effect       Essential hypertension - Primary    Stable exam with well controlled BP.  Currently taking metoprolol. Tolerating medications without concerns or side effects. Will continue to recommend low sodium diet  and current regimen.       Other Visit Diagnoses     Prostate cancer screening       Relevant Orders   PSA   Colon cancer screening       Relevant Orders   Fecal occult blood, imunochemical      He declines Prevnar-20 today.  Return in about 6 months (around 03/26/2023) for CPX, HTN.   Partially dictated using Dragon software, any errors are not intentional.  Glean Hess, MD [place], [place]

## 2022-09-23 NOTE — Assessment & Plan Note (Signed)
Doing well with rare episodes last about 12 hours - takes an extra metoprolol as needed. On ASA 325 mg daily - Eliquis deferred

## 2022-09-23 NOTE — Assessment & Plan Note (Signed)
On Cpap since 2008.  Uses nightly with good effect

## 2022-09-23 NOTE — Assessment & Plan Note (Signed)
Stable exam with well controlled BP.  Currently taking metoprolol. Tolerating medications without concerns or side effects. Will continue to recommend low sodium diet and current regimen.   

## 2022-09-24 LAB — COMPREHENSIVE METABOLIC PANEL
ALT: 23 IU/L (ref 0–44)
AST: 22 IU/L (ref 0–40)
Albumin: 4.3 g/dL (ref 3.9–4.9)
Alkaline Phosphatase: 104 IU/L (ref 44–121)
BUN/Creatinine Ratio: 10 (ref 10–24)
BUN: 10 mg/dL (ref 8–27)
Bilirubin Total: 0.8 mg/dL (ref 0.0–1.2)
CO2: 25 mmol/L (ref 20–29)
Calcium: 9.7 mg/dL (ref 8.6–10.2)
Chloride: 103 mmol/L (ref 96–106)
Creatinine, Ser: 1.01 mg/dL (ref 0.76–1.27)
Globulin, Total: 2.5 g/dL (ref 1.5–4.5)
Glucose: 87 mg/dL (ref 70–99)
Potassium: 4.6 mmol/L (ref 3.5–5.2)
Sodium: 140 mmol/L (ref 134–144)
Total Protein: 6.8 g/dL (ref 6.0–8.5)
eGFR: 81 mL/min/{1.73_m2} (ref >59)

## 2022-09-24 LAB — CBC WITH DIFFERENTIAL/PLATELET
Basophils Absolute: 0 10*3/uL (ref 0.0–0.2)
Basos: 0 %
EOS (ABSOLUTE): 0.2 10*3/uL (ref 0.0–0.4)
Eos: 3 %
Hematocrit: 46 % (ref 37.5–51.0)
Hemoglobin: 15.3 g/dL (ref 13.0–17.7)
Immature Grans (Abs): 0 10*3/uL (ref 0.0–0.1)
Immature Granulocytes: 0 %
Lymphocytes Absolute: 1.4 10*3/uL (ref 0.7–3.1)
Lymphs: 27 %
MCH: 31.5 pg (ref 26.6–33.0)
MCHC: 33.3 g/dL (ref 31.5–35.7)
MCV: 95 fL (ref 79–97)
Monocytes Absolute: 0.6 10*3/uL (ref 0.1–0.9)
Monocytes: 11 %
Neutrophils Absolute: 3.1 10*3/uL (ref 1.4–7.0)
Neutrophils: 59 %
Platelets: 207 10*3/uL (ref 150–450)
RBC: 4.85 x10E6/uL (ref 4.14–5.80)
RDW: 12.4 % (ref 11.6–15.4)
WBC: 5.3 10*3/uL (ref 3.4–10.8)

## 2022-09-24 LAB — LIPID PANEL
Chol/HDL Ratio: 3.8 ratio (ref 0.0–5.0)
Cholesterol, Total: 207 mg/dL — ABNORMAL HIGH (ref 100–199)
HDL: 54 mg/dL (ref >39)
LDL Chol Calc (NIH): 135 mg/dL — ABNORMAL HIGH (ref 0–99)
Triglycerides: 100 mg/dL (ref 0–149)
VLDL Cholesterol Cal: 18 mg/dL (ref 5–40)

## 2022-09-24 LAB — PSA: Prostate Specific Ag, Serum: 1.3 ng/mL (ref 0.0–4.0)

## 2022-09-25 ENCOUNTER — Encounter: Payer: Self-pay | Admitting: Internal Medicine

## 2022-09-25 DIAGNOSIS — E785 Hyperlipidemia, unspecified: Secondary | ICD-10-CM | POA: Insufficient documentation

## 2022-09-28 NOTE — Progress Notes (Signed)
FYI  [id number]

## 2023-05-28 ENCOUNTER — Ambulatory Visit: Payer: Self-pay | Admitting: Internal Medicine
